# Patient Record
Sex: Male | Born: 1960
Health system: Southern US, Community
[De-identification: ages and names within clinical notes are randomized; demographics above are authoritative.]

## PROBLEM LIST (undated history)

## (undated) DIAGNOSIS — J45909 Unspecified asthma, uncomplicated: Secondary | ICD-10-CM

## (undated) DIAGNOSIS — I10 Essential (primary) hypertension: Secondary | ICD-10-CM

## (undated) HISTORY — PX: KNEE SURGERY: SHX244

## (undated) HISTORY — PX: HERNIA REPAIR: SHX51

## (undated) HISTORY — PX: SHOULDER SURGERY: SHX246

---

## 2018-11-17 DIAGNOSIS — R52 Pain, unspecified: Secondary | ICD-10-CM | POA: Diagnosis not present

## 2018-11-17 DIAGNOSIS — J101 Influenza due to other identified influenza virus with other respiratory manifestations: Secondary | ICD-10-CM | POA: Diagnosis not present

## 2018-11-23 DIAGNOSIS — J0110 Acute frontal sinusitis, unspecified: Secondary | ICD-10-CM | POA: Diagnosis not present

## 2018-11-23 DIAGNOSIS — J45901 Unspecified asthma with (acute) exacerbation: Secondary | ICD-10-CM | POA: Diagnosis not present

## 2019-05-05 DIAGNOSIS — R0602 Shortness of breath: Secondary | ICD-10-CM | POA: Diagnosis not present

## 2019-05-05 DIAGNOSIS — R079 Chest pain, unspecified: Secondary | ICD-10-CM | POA: Diagnosis not present

## 2019-05-05 DIAGNOSIS — Z7951 Long term (current) use of inhaled steroids: Secondary | ICD-10-CM | POA: Diagnosis not present

## 2019-05-05 DIAGNOSIS — Z885 Allergy status to narcotic agent status: Secondary | ICD-10-CM | POA: Diagnosis not present

## 2019-05-05 DIAGNOSIS — R05 Cough: Secondary | ICD-10-CM | POA: Diagnosis not present

## 2019-05-05 DIAGNOSIS — R0789 Other chest pain: Secondary | ICD-10-CM | POA: Diagnosis not present

## 2019-05-05 DIAGNOSIS — J45909 Unspecified asthma, uncomplicated: Secondary | ICD-10-CM | POA: Diagnosis not present

## 2019-05-10 DIAGNOSIS — M791 Myalgia, unspecified site: Secondary | ICD-10-CM | POA: Diagnosis not present

## 2019-05-10 DIAGNOSIS — R011 Cardiac murmur, unspecified: Secondary | ICD-10-CM | POA: Diagnosis not present

## 2019-05-10 DIAGNOSIS — R0602 Shortness of breath: Secondary | ICD-10-CM | POA: Diagnosis not present

## 2019-05-10 DIAGNOSIS — R002 Palpitations: Secondary | ICD-10-CM | POA: Diagnosis not present

## 2019-05-11 DIAGNOSIS — R5383 Other fatigue: Secondary | ICD-10-CM | POA: Diagnosis not present

## 2019-05-11 DIAGNOSIS — R002 Palpitations: Secondary | ICD-10-CM | POA: Diagnosis not present

## 2019-05-11 DIAGNOSIS — J111 Influenza due to unidentified influenza virus with other respiratory manifestations: Secondary | ICD-10-CM | POA: Diagnosis not present

## 2019-05-11 DIAGNOSIS — R0602 Shortness of breath: Secondary | ICD-10-CM | POA: Diagnosis not present

## 2019-05-11 DIAGNOSIS — M791 Myalgia, unspecified site: Secondary | ICD-10-CM | POA: Diagnosis not present

## 2019-05-17 DIAGNOSIS — H539 Unspecified visual disturbance: Secondary | ICD-10-CM | POA: Diagnosis not present

## 2019-05-21 DIAGNOSIS — R002 Palpitations: Secondary | ICD-10-CM | POA: Diagnosis not present

## 2019-05-21 DIAGNOSIS — R0602 Shortness of breath: Secondary | ICD-10-CM | POA: Diagnosis not present

## 2019-05-21 DIAGNOSIS — R011 Cardiac murmur, unspecified: Secondary | ICD-10-CM | POA: Diagnosis not present

## 2019-05-21 DIAGNOSIS — M791 Myalgia, unspecified site: Secondary | ICD-10-CM | POA: Diagnosis not present

## 2019-06-17 DIAGNOSIS — I519 Heart disease, unspecified: Secondary | ICD-10-CM | POA: Diagnosis not present

## 2019-06-17 DIAGNOSIS — I080 Rheumatic disorders of both mitral and aortic valves: Secondary | ICD-10-CM | POA: Diagnosis not present

## 2019-06-17 DIAGNOSIS — I517 Cardiomegaly: Secondary | ICD-10-CM | POA: Diagnosis not present

## 2019-06-25 DIAGNOSIS — R0602 Shortness of breath: Secondary | ICD-10-CM | POA: Diagnosis not present

## 2019-06-25 DIAGNOSIS — R011 Cardiac murmur, unspecified: Secondary | ICD-10-CM | POA: Diagnosis not present

## 2019-06-25 DIAGNOSIS — R002 Palpitations: Secondary | ICD-10-CM | POA: Diagnosis not present

## 2019-06-25 DIAGNOSIS — M791 Myalgia, unspecified site: Secondary | ICD-10-CM | POA: Diagnosis not present

## 2019-07-09 DIAGNOSIS — M549 Dorsalgia, unspecified: Secondary | ICD-10-CM | POA: Diagnosis not present

## 2019-07-09 DIAGNOSIS — M542 Cervicalgia: Secondary | ICD-10-CM | POA: Diagnosis not present

## 2019-07-20 DIAGNOSIS — M47812 Spondylosis without myelopathy or radiculopathy, cervical region: Secondary | ICD-10-CM | POA: Diagnosis not present

## 2019-07-20 DIAGNOSIS — M47814 Spondylosis without myelopathy or radiculopathy, thoracic region: Secondary | ICD-10-CM | POA: Diagnosis not present

## 2019-07-20 DIAGNOSIS — Z87828 Personal history of other (healed) physical injury and trauma: Secondary | ICD-10-CM | POA: Diagnosis not present

## 2019-07-20 DIAGNOSIS — S199XXA Unspecified injury of neck, initial encounter: Secondary | ICD-10-CM | POA: Diagnosis not present

## 2019-07-20 DIAGNOSIS — M542 Cervicalgia: Secondary | ICD-10-CM | POA: Diagnosis not present

## 2019-07-23 DIAGNOSIS — Z712 Person consulting for explanation of examination or test findings: Secondary | ICD-10-CM | POA: Diagnosis not present

## 2019-11-08 ENCOUNTER — Encounter: Payer: Self-pay | Admitting: Emergency Medicine

## 2019-11-08 ENCOUNTER — Emergency Department (INDEPENDENT_AMBULATORY_CARE_PROVIDER_SITE_OTHER)
Admission: EM | Admit: 2019-11-08 | Discharge: 2019-11-08 | Disposition: A | Discharge status: Home / Self Care | Payer: BC Managed Care – PPO | Source: Home / Self Care

## 2019-11-08 ENCOUNTER — Emergency Department (INDEPENDENT_AMBULATORY_CARE_PROVIDER_SITE_OTHER): Payer: BC Managed Care – PPO

## 2019-11-08 ENCOUNTER — Other Ambulatory Visit: Payer: Self-pay

## 2019-11-08 DIAGNOSIS — S8992XA Unspecified injury of left lower leg, initial encounter: Secondary | ICD-10-CM | POA: Diagnosis not present

## 2019-11-08 DIAGNOSIS — M25462 Effusion, left knee: Secondary | ICD-10-CM

## 2019-11-08 HISTORY — DX: Essential (primary) hypertension: I10

## 2019-11-08 HISTORY — DX: Unspecified asthma, uncomplicated: J45.909

## 2019-11-08 MED ORDER — DICLOFENAC SODIUM 25 MG PO TBEC: 25.0000 mg | DELAYED_RELEASE_TABLET | Freq: Two times a day (BID) | ORAL | 0 refills | Status: AC | PRN

## 2019-11-08 MED ORDER — KETOROLAC TROMETHAMINE 60 MG/2ML IM SOLN: 60.0000 mg | Freq: Once | INTRAMUSCULAR | Status: DC

## 2019-11-08 NOTE — Discharge Instructions (Addendum)
Follow-up with Dr. Karie Schwalbe tomorrow. Prescribed diclofenac 25 mg twice daily as needed for knee pain.

## 2019-11-08 NOTE — ED Triage Notes (Signed)
Pt c/o left knee pain after falling while working in his garage yesterday. No history of left knee problems.

## 2019-11-08 NOTE — ED Provider Notes (Signed)
[facility]    CSN: [id number] Arrival date & time: 11/08/19  1028      History   Chief Complaint Chief Complaint  Patient presents with  . Knee Pain    HPI Hisao Doo is a 59 y.o. male.   HPI   Duriel Deery presents for evaluation of left knee pain.  Patient reports doing some work in his garage yesterday sequently fell and twisted his left leg injuring his left knee.  He reports some mild swelling although significant tenderness with bending and extending his knee.  He has noticed increased tenderness today and took an Aleve without significant improvement.  He did ice and elevate temporarily yesterday evening following the injury with minimal relief.  He denies any prior injury to his left knee.  Past Medical History:  Diagnosis Date  . Asthma   . Hypertension     There are no problems to display for this patient.   History reviewed. No pertinent surgical history.     Home Medications    Prior to Admission medications   Medication Sig Start Date End Date Taking? Authorizing Provider  albuterol (PROVENTIL) (2.5 MG/3ML) 0.083% nebulizer solution Take 3 mLs (2.5 mg dose) by nebulization every 6 (six) hours as needed for Wheezing. 11/23/18  Yes [provider]  ibuprofen (ADVIL) 200 MG tablet Take by mouth.    [provider]    Family History History reviewed. No pertinent family history.  Social History Social History   Tobacco Use  . Smoking status: Never Smoker  . Smokeless tobacco: Never Used  Substance Use Topics  . Alcohol use: Yes    Alcohol/week: 1.0 standard drinks    Types: 1 Cans of beer per week  . Drug use: Not on file     Allergies   Codeine   Review of Systems Review of Systems Pertinent negatives listed in HPI  Physical Exam Triage Vital Signs ED Triage Vitals  Enc Vitals Group     BP 11/08/19 1052 (!) 150/99     Pulse Rate 11/08/19 1052 80     Resp --      Temp 11/08/19 1052 98.5 F (36.9  C)     Temp Source 11/08/19 1052 Oral     SpO2 11/08/19 1052 97 %     Weight 11/08/19 1049 225 lb (102.1 kg)     Height --      Head Circumference --      Peak Flow --      Pain Score 11/08/19 1048 6     Pain Loc --      Pain Edu? --      Excl. in GC? --    No data found.  Updated Vital Signs BP (!) 150/99 (BP Location: Right Arm)   Pulse 80   Temp 98.5 F (36.9 C) (Oral)   Wt 225 lb (102.1 kg)   SpO2 97%   Visual Acuity Right Eye Distance:   Left Eye Distance:   Bilateral Distance:    Right Eye Near:   Left Eye Near:    Bilateral Near:     Physical Exam General appearance: alert, well developed, well nourished, cooperative and in no distress Head: Normocephalic, without obvious abnormality, atraumatic Respiratory: Respirations even and unlabored, normal respiratory rate Heart: rate and rhythm normal.  Extremities: Left knee: palpable tenderness anterior lateral patellar region , trace edema present , decrease ROM  Skin: Skin color, texture, turgor normal. No rashes seen  Psych: Appropriate mood  and affect. Neurologic: Alert, oriented to person, place, and time, thought content appropriate. UC Treatments / Results  Labs (all labs ordered are listed, but only abnormal results are displayed) Labs Reviewed - No data to display  EKG   Radiology No results found.  Procedures Procedures (including critical care time)  Medications Ordered in UC Medications - No data to display  Initial Impression / Assessment and Plan / UC Course  I have reviewed the triage vital signs and the nursing notes.  Pertinent labs & imaging results that were available during my care of the patient were reviewed by me and considered in my medical decision making (see chart for details).      Injury of left knee, initial encounter Effusion of left knee joint Imaging negative for an acute fracture and or chronic changes to left knee . Imaging revealed a left knee joint effusion.  Patient referred to sports medicine Dr. Darene Lamer for further evaluation. Diclofenac 25 mg BID PRN for pain and inflammation. Knee sleeve placed to provide compression and further aid in reduction of swelling and pain. Patient declined Toradol. Weight bearing as tolerated. Final Clinical Impressions(s) / UC Diagnoses   Final diagnoses:  Injury of left knee, initial encounter  Effusion of left knee joint     Discharge Instructions     Follow-up with Dr. Darene Lamer tomorrow. Prescribed diclofenac 25 mg twice daily as needed for knee pain.    ED Prescriptions    None     PDMP not reviewed this encounter.   Scot Jun, FNP 11/09/19 1134

## 2019-11-09 ENCOUNTER — Encounter: Payer: Self-pay | Admitting: Sports Medicine

## 2019-11-09 ENCOUNTER — Ambulatory Visit (INDEPENDENT_AMBULATORY_CARE_PROVIDER_SITE_OTHER): Payer: BC Managed Care – PPO | Admitting: Sports Medicine

## 2019-11-09 DIAGNOSIS — M5030 Other cervical disc degeneration, unspecified cervical region: Secondary | ICD-10-CM

## 2019-11-09 DIAGNOSIS — S8992XA Unspecified injury of left lower leg, initial encounter: Secondary | ICD-10-CM

## 2019-11-09 NOTE — Assessment & Plan Note (Addendum)
Slipped and fell in the garage, knee was forced into valgus. He had subsequent pain and swelling, mostly at the lateral joint line. I do feel some weakness at the ACL on exam, positive Lachman sign and anterior drawer sign, as well as pain with terminal flexion all consistent with injury to the ACL and lateral meniscus. Injection today. I would like an MRI. Return to see me to go over MRI results.

## 2019-11-09 NOTE — Addendum Note (Signed)
Addended by: Monica Becton on: 11/09/2019 09:51 AM   Modules accepted: Orders

## 2019-11-09 NOTE — Progress Notes (Addendum)
    Procedures performed today:    Procedure: Real-time Ultrasound Guided injection of left knee Device: Samsung HS60  Verbal informed consent obtained.  Time-out conducted.  Noted no overlying erythema, induration, or other signs of local infection.  Skin prepped in a sterile fashion.  Local anesthesia: Topical Ethyl chloride.  With sterile technique and under real time ultrasound guidance: 1 cc Kenalog 40, 2 cc lidocaine, 2 cc bupivacaine injected easily Completed without difficulty  Pain immediately resolved suggesting accurate placement of the medication.  Advised to call if fevers/chills, erythema, induration, drainage, or persistent bleeding.  Images permanently stored and available for review in the ultrasound unit.  Impression: Technically successful ultrasound guided injection.  Independent interpretation of tests performed by another provider:   I personally reviewed his x-rays, there for the most part unremarkable with the exception of an effusion.  Impression and Recommendations:    Left knee injury Slipped and fell in the garage, knee was forced into valgus. He had subsequent pain and swelling, mostly at the lateral joint line. I do feel some weakness at the ACL on exam, positive Lachman sign and anterior drawer sign, as well as pain with terminal flexion all consistent with injury to the ACL and lateral meniscus. Injection today. I would like an MRI. Return to see me to go over MRI results.  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 He had his cardiologist order cervical spine MRI, these results are available in care everywhere. We are going to start with aggressive physical therapy, diclofenac before considering intervention into the cervical  spine.    ___________________________________________ Ihor Austin. Benjamin Stain, M.D., ABFM., CAQSM. Primary Care and Sports Medicine [facility]  Adjunct Instructor of Family Medicine  [employer]

## 2019-11-09 NOTE — Assessment & Plan Note (Signed)
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 He had his cardiologist order cervical spine MRI, these results are available in care everywhere. We are going to start with aggressive physical therapy, diclofenac before considering intervention into the cervical spine.

## 2019-11-14 ENCOUNTER — Ambulatory Visit (INDEPENDENT_AMBULATORY_CARE_PROVIDER_SITE_OTHER): Payer: BC Managed Care – PPO

## 2019-11-14 ENCOUNTER — Other Ambulatory Visit: Payer: Self-pay

## 2019-11-14 DIAGNOSIS — S8992XA Unspecified injury of left lower leg, initial encounter: Secondary | ICD-10-CM

## 2019-11-14 DIAGNOSIS — M25562 Pain in left knee: Secondary | ICD-10-CM | POA: Diagnosis not present

## 2019-11-15 ENCOUNTER — Ambulatory Visit: Payer: BC Managed Care – PPO | Attending: Sports Medicine | Admitting: Physical Therapy

## 2019-11-15 ENCOUNTER — Encounter: Payer: Self-pay | Admitting: Physical Therapy

## 2019-11-15 DIAGNOSIS — M25562 Pain in left knee: Secondary | ICD-10-CM | POA: Diagnosis not present

## 2019-11-15 DIAGNOSIS — R262 Difficulty in walking, not elsewhere classified: Secondary | ICD-10-CM | POA: Diagnosis present

## 2019-11-15 NOTE — Therapy (Addendum)
[facility] [address]  [place], [place], [address] Phone: [phone]   Fax:  [phone]  Physical Therapy Evaluation  Patient Details  Name: Seth Gutierrez MRN: [id number] Date of Birth: [birth year] Referring Provider (PT): Aundria Mems, MD   Encounter Date: 11/15/2019  PT End of Session - 11/15/19 1139    Visit Number  1    Number of Visits  9    Date for PT Re-Evaluation  12/13/19    Authorization Type  BC[employer]    Authorization - Visit Number  1    Authorization - Number of Visits  30    PT Start Time  5208    PT Stop Time  1129    PT Time Calculation (min)  43 min    Activity Tolerance  Patient tolerated treatment well    Behavior During Therapy  [facility] for tasks assessed/performed       Past Medical History:  Diagnosis Date  . Asthma   . Hypertension     History reviewed. No pertinent surgical history.  There were no vitals filed for this visit.   Subjective Assessment - 11/15/19 1048    Subjective  Patient reporting L knee pain since a fall on 11/08/19. "Fell into a split" with the L knee falling into valgus. Pain occurs over L lateral knee. Had a cortisone injection which relieved the pain almost entirely. Today having only slight remaining pain. Worse with stairs, pivoting, and moving into valgus/varus. Knee feels "lose." Patient confirms a hx of neck pain but wanting to hold off on treatment for this d/t concern of a malignant cause of his symptoms such as a brain tumor. Notes that he has episodes where he "blacks out" when turning his head.    Pertinent History  HTN, asthma    Limitations  Standing;Walking;House hold activities    Diagnostic tests  11/14/19 L knee MRI: Negative for acute bony abnormality, meniscal or ligament tear. ACLworse than PCL mucoid degeneration noted. Subcutaneous edema anterior to the patellar and patellar tendon maybe due to contusion given the patient's history of  fall.    Patient Stated Goals  "better movement"    Currently in Pain?  Yes    Pain Score  3     Pain Location  Knee    Pain Orientation  Left;Lateral    Pain Descriptors / Indicators  Dull    Pain Type  Acute pain         [facility] PT Assessment - 11/15/19 1057      Assessment   Medical Diagnosis  Cervical DDD, L knee meniscal tear    Referring Provider (PT)  Aundria Mems, MD    Onset Date/Surgical Date  11/08/19    Next MD Visit  not scheduled    Prior Therapy  yes      Balance Screen   Has the patient fallen in the past 6 months  Yes    How many times?  1    Has the patient had a decrease in activity level because of a fear of falling?   No    Is the patient reluctant to leave their home because of a fear of falling?   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, stairs, walking    Leisure  Art therapist      Cognition   Overall Cognitive Status  Within Functional Limits for tasks assessed      Observation/Other Assessments   Observations  very mild L lateral knee edema      Sensation   Light Touch  Appears Intact   N/T in B hands intermittently     Coordination   Gross Motor Movements are Fluid and Coordinated  Yes      Posture/Postural Control   Posture/Postural Control  Postural limitations    Postural Limitations  Rounded Shoulders      ROM / Strength   AROM / PROM / Strength  AROM;Strength;PROM      AROM   AROM  Assessment Site  Knee    Right/Left Knee  Right;Left    Right Knee Extension  0    Right Knee Flexion  120    Left Knee Extension  0    Left Knee Flexion  125      PROM   PROM Assessment Site  Knee    Right/Left Knee  Right;Left    Right Knee Extension  0    Right Knee Flexion  125    Left Knee Extension  0    Left Knee Flexion  125      Strength   Strength Assessment Site  Hip;Knee;Ankle    Right/Left Hip  Right;Left    Right Hip Flexion  5/5    Right Hip ABduction  4+/5    Right Hip ADduction  4+/5    Left Hip Flexion  5/5    Left Hip ABduction  4+/5    Left Hip ADduction  4+/5    Right/Left Knee  Right;Left    Right Knee Flexion  5/5    Right Knee Extension  5/5    Left Knee Flexion  4/5   mild pain and c/o weakness   Left Knee Extension  4/5   mild pain and c/o weakness   Right/Left Ankle  Right;Left    Right Ankle Dorsiflexion  5/5    Right Ankle Plantar Flexion  4+/5    Left Ankle Dorsiflexion  5/5    Left Ankle Plantar Flexion  4+/5      Flexibility   Soft Tissue Assessment /Muscle Length  yes    Hamstrings  B WFL    Quadriceps  L moderately tight, R WFL in mod thomas position     ITB  L mildlt tight, R WFL      Palpation   Patella mobility  mild hypomobility in superior/inferior directions on R & L side    Palpation comment  mildly TTP over L superolateral aspect of patella      Ambulation/Gait   Assistive device  None    Gait Pattern  Step-through pattern;Within Functional Limits    Ambulation Surface  Level;Indoor    Gait velocity  WNL                Objective measurements completed on  examination: See above findings.              PT Education - 11/15/19 1139    Education Details  prognosis, POC, HEP    Person(s) Educated  Patient    Methods  Explanation;Demonstration;Tactile cues;Verbal cues;Handout    Comprehension  Verbalized understanding;Returned demonstration       PT Short Term Goals - 11/15/19 1146      PT SHORT  TERM GOAL #1   Title  Patient to be independent with initial HEP.    Time  2    Period  Weeks    Status  New    Target Date  11/29/19        PT Long Term Goals - 11/15/19 1147      PT LONG TERM GOAL #1   Title  Patient to be independent with advanced HEP.    Time  4    Period  Weeks    Status  New    Target Date  12/13/19      PT LONG TERM GOAL #2   Title  Patient to demonstrate B LE strength 4+/5 without pain.    Time  4    Period  Weeks    Status  New    Target Date  12/13/19      PT LONG TERM GOAL #3   Title  Patient to demonstrate no tighness in L hip flexor/quad and TFL.    Time  4    Period  Weeks    Status  New    Target Date  12/13/19      PT LONG TERM GOAL #4   Title  Patient to report 0/10 pain with stairs.    Time  4    Period  Weeks    Status  New    Target Date  12/13/19      PT LONG TERM GOAL #5   Title  Patient to return to fitness regimen without pain limiting.    Time  4    Period  Weeks    Status  New    Target Date  12/13/19             Plan - 11/15/19 1140    Clinical Impression Statement  Patient is a 59y/o M presenting to OPPT with c/o L lateral knee pain after a fall on 11/08/19. Received injection which has relieved nearly all pain. Remaining pain occurs over L lateral knee. Worse with stairs, pivoting, and moving into valgus/varus. Patient notes that his knee feels "loose." Received PT order also includes neck pain, however patient requesting to hold off on treatment for this for now d/t concerns of malignancy and "black outs." Patient today presenting with decreased L LE strength, tightness in L hip flexors/quads and TFL, B patellar hypomobility in superior/inferior directions, and tenderness over L superolateral aspect of patella. Patient educated on gentle stretching and strengthening HEP- patient reported understanding. Would benefit from skilled PT services 2x/week for 4 weeks as needed to address remaining impairments.     Personal Factors and Comorbidities  Age;Comorbidity 2;Fitness;Past/Current Experience;Profession;Time since onset of injury/illness/exacerbation    Comorbidities  HTN, asthma    Examination-Activity Limitations  Bend;Squat;Stairs;Stand;Locomotion Level;Lift    Examination-Participation Restrictions  Church;Cleaning;Shop;Yard Work;Interpersonal Relationship;Meal Prep    Stability/Clinical Decision Making  Stable/Uncomplicated    Clinical Decision Making  Low    Rehab Potential  Good    PT Frequency  2x / week    PT Duration  4  weeks    PT Treatment/Interventions  ADLs/Self Care Home Management;Cryotherapy;Electrical Stimulation;Iontophoresis 58m/ml Dexamethasone;Moist Heat;Balance training;Therapeutic exercise;Therapeutic activities;Functional mobility training;Stair training;Gait training;Ultrasound;Neuromuscular re-education;Patient/family education;Manual techniques;Vasopneumatic Device;Taping;Energy conservation;Dry needling;Passive range of motion    PT Next Visit Plan  reassess HEP    Consulted and Agree with Plan of Care  Patient       Patient will benefit from skilled therapeutic intervention in order to improve the following deficits and impairments:  Hypomobility, Increased edema, Decreased activity tolerance, Decreased strength, Pain, Difficulty walking, Postural dysfunction, Impaired flexibility  Visit Diagnosis: Acute pain of left knee  Difficulty in walking, not elsewhere classified     Problem List Patient Active Problem List   Diagnosis Date Noted  . Left knee injury 11/09/2019  . DDD (degenerative disc disease), cervical 11/09/2019      YJanene Harvey PT, DPT 11/15/19 11:51 AM   [facility] [place] [address]Phone: [phone]  Fax:  [phone] Name: Seth VanwagonerMRN: [id number] of Birth: [birth year]  PHYSICAL THERAPY DISCHARGE SUMMARY  Visits from  Start of Care: 1  Current functional level related to goals / functional outcomes: Unable to assess; patient did not return   Remaining deficits: See above   Education / Equipment: HEP  Plan: Patient agrees to discharge.  Patient goals were not met. Patient is being discharged due to not returning since the last visit.  ?????     YJanene Harvey PT, DPT 12/22/19 10:46 AM

## 2019-11-17 ENCOUNTER — Ambulatory Visit (INDEPENDENT_AMBULATORY_CARE_PROVIDER_SITE_OTHER): Payer: BC Managed Care – PPO | Admitting: Sports Medicine

## 2019-11-17 ENCOUNTER — Other Ambulatory Visit: Payer: Self-pay

## 2019-11-17 DIAGNOSIS — H547 Unspecified visual loss: Secondary | ICD-10-CM | POA: Diagnosis not present

## 2019-11-17 DIAGNOSIS — S8992XD Unspecified injury of left lower leg, subsequent encounter: Secondary | ICD-10-CM | POA: Diagnosis not present

## 2019-11-17 DIAGNOSIS — M5030 Other cervical disc degeneration, unspecified cervical region: Secondary | ICD-10-CM | POA: Diagnosis not present

## 2019-11-17 NOTE — Assessment & Plan Note (Signed)
Seth Gutierrez also has had chronic neck pain, he is a power lifter, and a significant history of cervical DDD, MRI results are available in care everywhere but he has multilevel widespread degenerative changes with multiple levels of foraminal stenosis. He has significant discomfort with turning his head to the right, and radiation down the right arm in a C7 distribution. We will start with formal physical therapy for 6 weeks before considering a cervical epidural. Continue diclofenac, he desires to avoid other medications including muscle relaxers.

## 2019-11-17 NOTE — Assessment & Plan Note (Signed)
In addition to the below Seth Gutierrez does complain of visual loss when he turns his head to the right to the extreme. He also feels as though he is going to pass out sometimes. My concern is certainly a vascular process, I would like him to get an opinion from vascular surgery. He did not note any improvement with systemic effect from his knee injection. We are going to check some labs including ESR and antineutrophil cytoplasmic antibody titers as this could represent a vasculitis.

## 2019-11-17 NOTE — Assessment & Plan Note (Signed)
[place] returns, he slipped and fell in the garage and his knee was forced into valgus. His MRI showed only areas of contusion, we did inject his knee at that time and he returns today completely pain-free.

## 2019-11-17 NOTE — Progress Notes (Signed)
    Procedures performed today:    None.  Independent interpretation of tests performed by another provider:   Cervical spine MRI October 2020:  C2-C3: No significant disc herniation, spinal canal, or neuroforaminal compromise. C3-C4: Facet arthrosis on the RIGHT. Disc osteophytic complex on the LEFT. This results in mild RIGHT neuroforaminal stenosis. Spinal canal and LEFT neuroforaminal patent. C4-C5: Small disc osteophytic complex producing mild effacement of the ventral thecal sac. There is mild RIGHT neuroforaminal stenosis. The LEFT neuroforamen is patent. C5-C6: Loss of disc height with a disc osteophytic complex, more pronounced on the LEFT. Mild effacement of the ventral thecal sac and LEFT neural foraminal stenosis. RIGHT neuroforamen is patent. C6-C7: Loss of disc height. Spinal canal and foramen are patent. C7-T1: No significant disc herniation, spinal canal, or neuroforaminal compromise.  Impression and Recommendations:    Left knee injury Caster returns, he slipped and fell in the garage and his knee was forced into valgus. His MRI showed only areas of contusion, we did inject his knee at that time and he returns today completely pain-free.  DDD (degenerative disc disease), cervical Helio also has had chronic neck pain, he is a power lifter, and a significant history of cervical DDD, MRI results are available in care everywhere but he has multilevel widespread degenerative changes with multiple levels of foraminal stenosis. He has significant discomfort with turning his head to the right, and radiation down the right arm in a C7 distribution. We will start with formal physical therapy for 6 weeks before considering a cervical epidural. Continue diclofenac, he desires to avoid other medications including muscle relaxers.   Visual loss In addition to the below [place] does complain of visual loss when he turns his head to the right to the extreme. He also feels as though he is  going to pass out sometimes. My concern is certainly a vascular process, I would like him to get an opinion from vascular surgery. He did not note any improvement with systemic effect from his knee injection. We are going to check some labs including ESR and antineutrophil cytoplasmic antibody titers as this could represent a vasculitis.    ___________________________________________ Gwen Her. Dianah Field, M.D., ABFM., CAQSM. Primary Care and [place] Instructor of [place] of [employer] [facility] [employer]

## 2019-11-18 LAB — COMPLETE METABOLIC PANEL WITH GFR
AG Ratio: 2 (calc) (ref 1.0–2.5)
ALT: 61 U/L — ABNORMAL HIGH (ref 9–46)
AST: 31 U/L (ref 10–35)
Albumin: 4.3 g/dL (ref 3.6–5.1)
Alkaline phosphatase (APISO): 48 U/L (ref 35–144)
BUN: 19 mg/dL (ref 7–25)
CO2: 33 mmol/L — ABNORMAL HIGH (ref 20–32)
Calcium: 9.2 mg/dL (ref 8.6–10.3)
Chloride: 98 mmol/L (ref 98–110)
Creat: 1.12 mg/dL (ref 0.70–1.33)
GFR, Est African American: 85 mL/min/{1.73_m2} (ref >=60)
GFR, Est Non African American: 73 mL/min/{1.73_m2} (ref >=60)
Globulin: 2.2 g/dL (calc) (ref 1.9–3.7)
Glucose, Bld: 87 mg/dL (ref 65–99)
Potassium: 4.4 mmol/L (ref 3.5–5.3)
Sodium: 137 mmol/L (ref 135–146)
Total Bilirubin: 0.9 mg/dL (ref 0.2–1.2)
Total Protein: 6.5 g/dL (ref 6.1–8.1)

## 2019-11-18 LAB — ANCA SCREEN W REFLEX TITER: ANCA Screen: NEGATIVE

## 2019-11-18 LAB — CBC
HCT: 50.5 % — ABNORMAL HIGH (ref 38.5–50.0)
Hemoglobin: 18 g/dL — ABNORMAL HIGH (ref 13.2–17.1)
MCH: 32 pg (ref 27.0–33.0)
MCHC: 35.6 g/dL (ref 32.0–36.0)
MCV: 89.7 fL (ref 80.0–100.0)
MPV: 9.8 fL (ref 7.5–12.5)
Platelets: 175 10*3/uL (ref 140–400)
RBC: 5.63 10*6/uL (ref 4.20–5.80)
RDW: 12.9 % (ref 11.0–15.0)
WBC: 5.8 10*3/uL (ref 3.8–10.8)

## 2019-11-18 LAB — SEDIMENTATION RATE: Sed Rate: 2 mm/h (ref 0–20)

## 2019-11-18 LAB — C-REACTIVE PROTEIN: CRP: 2.5 mg/L (ref <8.0)

## 2019-11-29 ENCOUNTER — Other Ambulatory Visit: Payer: Self-pay | Admitting: *Deleted

## 2019-11-29 DIAGNOSIS — H547 Unspecified visual loss: Secondary | ICD-10-CM

## 2019-11-29 DIAGNOSIS — R0989 Other specified symptoms and signs involving the circulatory and respiratory systems: Secondary | ICD-10-CM

## 2019-11-30 ENCOUNTER — Ambulatory Visit (HOSPITAL_COMMUNITY)
Admission: RE | Admit: 2019-11-30 | Discharge: 2019-11-30 | Disposition: A | Discharge status: Home / Self Care | Payer: BC Managed Care – PPO | Source: Ambulatory Visit | Attending: Vascular Surgery | Admitting: Vascular Surgery

## 2019-11-30 ENCOUNTER — Ambulatory Visit (INDEPENDENT_AMBULATORY_CARE_PROVIDER_SITE_OTHER): Payer: BC Managed Care – PPO | Admitting: Vascular Surgery

## 2019-11-30 ENCOUNTER — Other Ambulatory Visit: Payer: Self-pay

## 2019-11-30 ENCOUNTER — Encounter: Payer: Self-pay | Admitting: Vascular Surgery

## 2019-11-30 VITALS — BP 145/91 | HR 84 | Temp 97.9°F | Resp 18 | Ht 69.5 in | Wt 220.0 lb

## 2019-11-30 DIAGNOSIS — H547 Unspecified visual loss: Secondary | ICD-10-CM

## 2019-11-30 DIAGNOSIS — R0989 Other specified symptoms and signs involving the circulatory and respiratory systems: Secondary | ICD-10-CM | POA: Diagnosis not present

## 2019-11-30 NOTE — Progress Notes (Signed)
Patient name: Seth Gutierrez MRN: [id number] DOB: [birth year] Sex: male  REASON FOR CONSULT: Visual changes when he turns his head, rule out vascular etiology  HPI: Seth Gutierrez is a 59 y.o. male, with history of hypertension that presents for evaluation of bilateral visual changes when he turns his head.  Patient states this has been ongoing for years and has been become more frequent recently.  He states whenever he turns his head either to the right or left he often will get vision changes sometimes in one eye sometimes in both eyes.  He does not actually lose vision but states things just appear hazy like he is wearing sunglasses.  Sometimes this will last for several minutes and then resolve.  He states he was told he had optical migraines by eye doctor years ago but has never seen a specialists or treated otherwise.  He also has significant pain in his right shoulder up into the right neck that limits his ability to do a lot of lifting and bodybuilding.  He wants to get back in the gym but between the vision changes in the shoulder problem he has a lot of limitation.  He did have an MRI of his cervical spine that showed cervical spondylosis with mild canal stenosis.  No history of TIA or stroke.  No other focal neurologic changes whenever he has these eye events.  He does complain of some numbness in both hands particularly in the second and third index finger bilaterally at times.  He gets some relief in the should pain with alcohol or hot tub.    Past Medical History:  Diagnosis Date  . Asthma   . Hypertension     Past Surgical History:  Procedure Laterality Date  . HERNIA REPAIR    . KNEE SURGERY Right   . SHOULDER SURGERY Right     Family History  Problem Relation Age of Onset  . Diabetes Mother   . Stroke Mother   . Atrial fibrillation Mother   . Diverticulitis Mother   . Angioedema Father   . Aneurysm Father   . Cancer Father     SOCIAL HISTORY: Social History    Socioeconomic History  . Marital status: Married    Spouse name: Not on file  . Number of children: Not on file  . Years of education: Not on file  . Highest education level: Not on file  Occupational History  . Not on file  Tobacco Use  . Smoking status: Never Smoker  . Smokeless tobacco: Never Used  Substance and Sexual Activity  . Alcohol use: Yes    Alcohol/week: 1.0 standard drinks    Types: 1 Cans of beer per week  . Drug use: Not on file  . Sexual activity: Not on file  Other Topics Concern  . Not on file  Social History Narrative  . Not on file   Social Determinants of Health   Financial Resource Strain:   . Difficulty of Paying Living Expenses: Not on file  Food Insecurity:   . Worried About Programme researcher, broadcasting/film/video in the Last Year: Not on file  . Ran Out of Food in the Last Year: Not on file  Transportation Needs:   . Lack of Transportation (Medical): Not on file  . Lack of Transportation (Non-Medical): Not on file  Physical Activity:   . Days of Exercise per Week: Not on file  . Minutes of Exercise per Session: Not on file  Stress:   .  Feeling of Stress : Not on file  Social Connections:   . Frequency of Communication with Friends and Family: Not on file  . Frequency of Social Gatherings with Friends and Family: Not on file  . Attends Religious Services: Not on file  . Active Member of Clubs or Organizations: Not on file  . Attends Banker Meetings: Not on file  . Marital Status: Not on file  Intimate Partner Violence:   . Fear of Current or Ex-Partner: Not on file  . Emotionally Abused: Not on file  . Physically Abused: Not on file  . Sexually Abused: Not on file    Allergies  Allergen Reactions  . Codeine Nausea And Vomiting    Current Outpatient Medications  Medication Sig Dispense Refill  . albuterol (PROVENTIL) (2.5 MG/3ML) 0.083% nebulizer solution Take 3 mLs (2.5 mg dose) by nebulization every 6 (six) hours as needed for  Wheezing.    Marland Kitchen ibuprofen (ADVIL) 200 MG tablet Take by mouth.    . diclofenac (VOLTAREN) 25 MG EC tablet Take 1 tablet (25 mg total) by mouth 2 (two) times daily as needed. (Patient not taking: Reported on 11/09/2019) 30 tablet 0   No current facility-administered medications for this visit.    REVIEW OF SYSTEMS:  [X]  denotes positive finding, [ ]  denotes negative finding Cardiac  Comments:  Chest pain or chest pressure:    Shortness of breath upon exertion:    Short of breath when lying flat:    Irregular heart rhythm:        Vascular    Pain in calf, thigh, or hip brought on by ambulation:    Pain in feet at night that wakes you up from your sleep:     Blood clot in your veins:    Leg swelling:         Pulmonary    Oxygen at home:    Productive cough:     Wheezing:         Neurologic    Sudden weakness in arms or legs:     Sudden numbness in arms or legs:     Sudden onset of difficulty speaking or slurred speech:    Temporary loss of vision in one eye:     Problems with dizziness:         Gastrointestinal    Blood in stool:     Vomited blood:         Genitourinary    Burning when urinating:     Blood in urine:        Psychiatric    Major depression:         Hematologic    Bleeding problems:    Problems with blood clotting too easily:        Skin    Rashes or ulcers:        Constitutional    Fever or chills:      PHYSICAL EXAM: Vitals:   11/30/19 0848 11/30/19 0856  BP: (!) 147/87 (!) 145/91  Pulse: 84 84  Resp: 18   Temp: 97.9 F (36.6 C)   TempSrc: Temporal   SpO2: 100%   Weight: 220 lb (99.8 kg)   Height: 5' 9.5" (1.765 m)     GENERAL: The patient is a well-nourished male, in no acute distress. The vital signs are documented above. CARDIAC: There is a regular rate and rhythm.  VASCULAR:  2+ palpable subclavian, brachial, radial pulses bilateral upper extremities Good grip strength bilateral  upper extremities No upper extremity tissue  loss PULMONARY: There is good air exchange bilaterally without wheezing or rales. ABDOMEN: Soft and non-tender with normal pitched bowel sounds.  MUSCULOSKELETAL: There are no major deformities or cyanosis. NEUROLOGIC: No focal weakness or paresthesias are detected. SKIN: There are no ulcers or rashes noted. PSYCHIATRIC: The patient has a normal affect.  DATA:   I have independently reviewed his carotid duplex and he has no significant carotid disease and both arteries appear fairly smooth and homogenous at the bifurcation.  He has antegrade flow in both vertebral arteries which is normal.  He also has normal flow hemodynamics in both subclavian arteries.  Assessment/Plan:  59 year old male that presents for evaluation of bilateral vision changes whenever he turns his head either to the right or left.  I discussed with him in detail that I do not think this is vascular in etiology.  He has no significant carotid disease based on duplex today and has antegrade flow in both vertebral arteries and we have no evidence of vertebrobasilar insufficiency.  In addition he has normal flow hemodynamics in both subclavian arteries and palpable radial pulses on exam.  He did have provocative maneuvers in the vascular lab today with his ultrasound and it showed no significant change in any of his vascular studies with turning his head.  Not certain if these vision changes are related to this questionable history of ocular migraines.  I suggested that we refer him over to neurology to have further evaluation and possible to see if they have any treatment options for him.  He is obviously very concerned about this when he is driving and is worried about having a car accident.  In addition I think he can go back to sports medicine for further work-up of his shoulder and neck problems as it relates to weight lifting and I do not feel that this is a vascular problem at this time.  Follow-up with future questions or  concerns.   Marty Heck, MD [facility] Office: [phone]

## 2019-12-18 DIAGNOSIS — Z23 Encounter for immunization: Secondary | ICD-10-CM | POA: Diagnosis not present

## 2019-12-29 ENCOUNTER — Ambulatory Visit: Payer: BC Managed Care – PPO | Admitting: Sports Medicine

## 2020-01-11 NOTE — Progress Notes (Deleted)
[facility]    Provider:  Dr Lucia Gaskins Requesting Provider: Sherald Hess, MD Primary Care Provider:  Sunnie Nielsen, MD  CC:  ***  HPI:  Seth Gutierrez is a 59 y.o. male here as requested by Sherald Hess, MD for evaluation of ocular migraines.  I reviewed Dr. Ophelia Charter notes, patient has a history of hypertension and presented for evaluation of bilateral visual changes when he turns his head, has been ongoing for years and has become more frequently recently, whenever he turns his head either to the right of the left he will often get vision changes sometimes in 1 eye sometimes a boat, he does not actually lose vision but things just appear.  Asses, sometimes it will last several minutes and then resolved, he was told he had optical migraines by an eye doctor year ago, he did have an MRI of his cervical spine that showed cervical spondylosis with mild canal stenosis, no history of stroke or TIA, no other focal neurologic changes never he has the symptoms, he does complain of some numbness in both hands.  Examination noted 2+ palpable subclavian, brachial, radial pulses bilateral upper extremities, good grip strength bilateral upper extremities, no upper extremity tissue loss, normal pulmonary, abdomen, musculoskeletal, neurologic, skin and psychiatric evaluations.  Carotid duplex showed no significant disease in both arteries appear fairly smooth and homogenous at the bifurcation with anterograde flow in both vertebral arteries which is normal, he also has normal flow dynamics in both subclavian arteries.  Dr. Chestine Spore did not think this was vascular in etiology, no significant carotid or vertebral artery disease, no evidence of vertebrobasilar insufficiency, normal flow hemodynamics in both subclavian arteries and palpable radial pulses on exam.  We also tested him with provocative maneuvers in the vascular lab with his ultrasound and it showed no sign of changes in any of his  vascular studies with turning of head.  Reviewed notes, labs and imaging from outside physicians, which showed ***  Review of Systems: Patient complains of symptoms per HPI as well as the following symptoms ***. Pertinent negatives and positives per HPI. All others negative.   Social History   Socioeconomic History  . Marital status: Married    Spouse name: Not on file  . Number of children: Not on file  . Years of education: Not on file  . Highest education level: Not on file  Occupational History  . Not on file  Tobacco Use  . Smoking status: Never Smoker  . Smokeless tobacco: Never Used  Substance and Sexual Activity  . Alcohol use: Yes    Alcohol/week: 1.0 standard drinks    Types: 1 Cans of beer per week  . Drug use: Not on file  . Sexual activity: Not on file  Other Topics Concern  . Not on file  Social History Narrative  . Not on file   Social Determinants of Health   Financial Resource Strain:   . Difficulty of Paying Living Expenses:   Food Insecurity:   . Worried About Programme researcher, broadcasting/film/video in the Last Year:   . Barista in the Last Year:   Transportation Needs:   . Freight forwarder (Medical):   Marland Kitchen Lack of Transportation (Non-Medical):   Physical Activity:   . Days of Exercise per Week:   . Minutes of Exercise per Session:   Stress:   . Feeling of Stress :   Social Connections:   . Frequency of Communication with Friends and Family:   .  Frequency of Social Gatherings with Friends and Family:   . Attends Religious Services:   . Active Member of Clubs or Organizations:   . Attends Banker Meetings:   Marland Kitchen Marital Status:   Intimate Partner Violence:   . Fear of Current or Ex-Partner:   . Emotionally Abused:   Marland Kitchen Physically Abused:   . Sexually Abused:     Family History  Problem Relation Age of Onset  . Diabetes Mother   . Stroke Mother   . Atrial fibrillation Mother   . Diverticulitis Mother   . Angioedema Father   .  Aneurysm Father   . Cancer Father     Past Medical History:  Diagnosis Date  . Asthma   . Hypertension     Patient Active Problem List   Diagnosis Date Noted  . Visual loss 11/17/2019  . Left knee injury 11/09/2019  . DDD (degenerative disc disease), cervical 11/09/2019    Past Surgical History:  Procedure Laterality Date  . HERNIA REPAIR    . KNEE SURGERY Right   . SHOULDER SURGERY Right     Current Outpatient Medications  Medication Sig Dispense Refill  . albuterol (PROVENTIL) (2.5 MG/3ML) 0.083% nebulizer solution Take 3 mLs (2.5 mg dose) by nebulization every 6 (six) hours as needed for Wheezing.    . diclofenac (VOLTAREN) 25 MG EC tablet Take 1 tablet (25 mg total) by mouth 2 (two) times daily as needed. (Patient not taking: Reported on 11/09/2019) 30 tablet 0  . ibuprofen (ADVIL) 200 MG tablet Take by mouth.     No current facility-administered medications for this visit.    Allergies as of 01/12/2020 - Review Complete 11/30/2019  Allergen Reaction Noted  . Codeine Nausea And Vomiting 03/20/2012    Vitals: There were no vitals taken for this visit. Last Weight:  Wt Readings from Last 1 Encounters:  11/30/19 220 lb (99.8 kg)   Last Height:   Ht Readings from Last 1 Encounters:  11/30/19 5' 9.5" (1.765 m)     Physical exam: Exam: Gen: NAD, conversant, well nourised, obese, well groomed                     CV: RRR, no MRG. No Carotid Bruits. No peripheral edema, warm, nontender Eyes: Conjunctivae clear without exudates or hemorrhage  Neuro: Detailed Neurologic Exam  Speech:    Speech is normal; fluent and spontaneous with normal comprehension.  Cognition:    The patient is oriented to person, place, and time;     recent and remote memory intact;     language fluent;     normal attention, concentration,     fund of knowledge Cranial Nerves:    The pupils are equal, round, and reactive to light. The fundi are normal and spontaneous venous pulsations  are present. Visual fields are full to finger confrontation. Extraocular movements are intact. Trigeminal sensation is intact and the muscles of mastication are normal. The face is symmetric. The palate elevates in the midline. Hearing intact. Voice is normal. Shoulder shrug is normal. The tongue has normal motion without fasciculations.   Coordination:    Normal finger to nose and heel to shin. Normal rapid alternating movements.   Gait:    Heel-toe and tandem gait are normal.   Motor Observation:    No asymmetry, no atrophy, and no involuntary movements noted. Tone:    Normal muscle tone.    Posture:    Posture is normal. normal erect  Strength:    Strength is V/V in the upper and lower limbs.      Sensation: intact to LT     Reflex Exam:  DTR's:    Deep tendon reflexes in the upper and lower extremities are normal bilaterally.   Toes:    The toes are downgoing bilaterally.   Clonus:    Clonus is absent.    Assessment/Plan:    No orders of the defined types were placed in this encounter.  No orders of the defined types were placed in this encounter.   Cc: Barbette Or, MD,  Barbette Or, MD  Sarina Ill, MD  [facility] [address] [place], [place] [address]  Phone [phone] Fax [phone]

## 2020-01-12 ENCOUNTER — Ambulatory Visit: Payer: BC Managed Care – PPO | Admitting: Neurology

## 2020-01-12 ENCOUNTER — Telehealth: Payer: Self-pay | Admitting: Neurology

## 2020-01-12 NOTE — Telephone Encounter (Signed)
I see pt has been rescheduled .

## 2020-01-12 NOTE — Telephone Encounter (Signed)
I called patient and LVM about 30 minutes prior to appointment to reschedule due to an emergency that came up with one of Dr. Trevor Mace other morning patients. Did not make contact with patient to reschedule. FYI

## 2020-02-15 NOTE — Progress Notes (Addendum)
[facility]    Provider:  Dr Jaynee Eagles Requesting Provider:  Monica Martinez MD Primary Care Provider:  Barbette Or, MD  CC:  Vision changes  HPI:  Seth Gutierrez is a 59 y.o. male here as requested by  Monica Martinez MD for ocular migraines.  Past medical history hypertension, asthma, and bilateral vision changes.  I reviewed Dr. Ainsley Spinner notes: Patient reported bilateral vision changes when he turns his head, has been going on for years and becoming more frequent recently, whenever he turns his head either to the right to the left he will often get vision changes sometimes in 1 eye sometimes in both eyes.  He does not actually lose vision but states things just appear hazy like he is wearing sunglasses.  Sometimes this will last several minutes and then resolved.  He states he was told he had optical migraines by an eye doctor years ago but has never seen a specialist or treated otherwise.  He did have an MRI of his cervical spine that showed cervical spondylosis with mild canal stenosis, no history of TIA or stroke, no other focal neurologic changes whenever he has these episodes, he does complain of some numbness in both hands particularly in the second and third index fingers bilaterally at times.  He has chronic shoulder pain.  Physical examination including cardiac, vascular, pulmonary, abdomen, musculoskeletal, neurologic, skin and psychiatric were normal per Dr. Ainsley Spinner note.  Dr. Carlis Abbott thought that the vision issues are not vascular in etiology, however he has no significant carotid artery disease on testing and antegrade flow in both vertebral arteries and no evidence of vertebrobasilar insufficiency.  In addition normal flow hemodynamics palpable pulses on exam he did have provocative maneuvers in the vascular lab with his ultrasound and it showed no significant change in his vascular studies when turning his head.  He is here alone and he reports everything started  with tightness in the back and neck making it difficult to move neck, when he would turn to the right it would feel like it affects like pulling on his eye, also some blurring when looking to the right and can go dark in the right eye only when turning to the right, resolves quickly, it can also be in both eyes but mostly on the right and it is associated with tightness in the right cervical muscles. More in the morning. He has DIFFERENT issues when not turning t the right but this is different, he starts seeing spinning flashing lights but this is not associated in one or both eyes followed by slight headache on the right side of the head. Headaches in the temple area, flashing lights and to both eyes and interrupts his site 15 minutes maximum. He now has glasses, he doesn't wear them all the time. No weakness, numbness, aphasia. This has been ongoing for years and getting better less frequent as far as the flashing lights and the turning to the right vision changes. Seems to be related to a lot of pain in the neck and the strain on his eyes are better. 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 No other focal neurologic deficits, associated symptoms, inciting events or modifiable factors.  Reviewed notes, labs and imaging from outside physicians, which showed:  I reviewed laboratory studies which showed ANCA normal, CRP normal sed rate normal, CMP with elevated ALT slightly 61, CBC with increased hemoglobin at  18, otherwise normal.  Per review of record from "care everywhere" patient had multiple ultrasound carotids bilateral in the past including August 2020, April 2017,   Review of Systems: Patient complains of symptoms per HPI as well as the following symptoms: headaches. Pertinent negatives and positives per HPI. All others negative.   Social History   Socioeconomic History  . Marital  status: Married    Spouse name: Not on file  . Number of children: 3  . Years of education: Not on file  . Highest education level: Associate degree: academic program  Occupational History  . Not on file  Tobacco Use  . Smoking status: Never Smoker  . Smokeless tobacco: Never Used  Substance and Sexual Activity  . Alcohol use: Yes    Alcohol/week: 21.0 standard drinks    Types: 21 Shots of liquor per week    Comment: 3 vodka mixed drinks per night, says it's a little more than shot each drink   . Drug use: Not on file    Comment: no  . Sexual activity: Not on file  Other Topics Concern  . Not on file  Social History Narrative   Lives at home with wife and his mother lives with them (they are her caretakers)   Right handed   Caffeine: 1 cup coffee in the morning    Social Determinants of Health   Financial Resource Strain:   . Difficulty of Paying Living Expenses:   Food Insecurity:   . Worried About Programme researcher, broadcasting/film/video in the Last Year:   . Barista in the Last Year:   Transportation Needs:   . Freight forwarder (Medical):   Marland Kitchen Lack of Transportation (Non-Medical):   Physical Activity:   . Days of Exercise per Week:   . Minutes of Exercise per Session:   Stress:   . Feeling of Stress :   Social Connections:   . Frequency of Communication with Friends and Family:   . Frequency of Social Gatherings with Friends and Family:   . Attends Religious Services:   . Active Member of Clubs or Organizations:   . Attends Banker Meetings:   Marland Kitchen Marital Status:   Intimate Partner Violence:   . Fear of Current or Ex-Partner:   . Emotionally Abused:   Marland Kitchen Physically Abused:   . Sexually Abused:     Family History  Problem Relation Age of Onset  . Diabetes Mother   . Stroke Mother   . Atrial fibrillation Mother   . Diverticulitis Mother   . Angioedema Father   . Aneurysm Father   . Cancer Father   . Migraines Neg Hx        none that he knows of      Past Medical History:  Diagnosis Date  . Asthma   . Hypertension     Patient Active Problem List   Diagnosis Date Noted  . Myofascial pain syndrome, cervical 02/16/2020  . Migraine with aura and without status migrainosus, not intractable 02/16/2020  . Visual loss 11/17/2019  . Left knee injury 11/09/2019  . DDD (degenerative disc disease), cervical 11/09/2019    Past Surgical History:  Procedure Laterality Date  . HERNIA REPAIR    . KNEE SURGERY Right   . SHOULDER SURGERY Right     Current Outpatient Medications  Medication Sig Dispense Refill  . albuterol (PROVENTIL) (2.5 MG/3ML) 0.083% nebulizer solution Take 3 mLs (2.5 mg dose) by nebulization every 6 (six) hours  as needed for Wheezing.    Marland Kitchen ibuprofen (ADVIL) 200 MG tablet Take by mouth.    . cyclobenzaprine (FLEXERIL) 10 MG tablet Take 1 tablet (10 mg total) by mouth at bedtime. 30 tablet 6  . diclofenac (VOLTAREN) 25 MG EC tablet Take 1 tablet (25 mg total) by mouth 2 (two) times daily as needed. (Patient not taking: Reported on 11/09/2019) 30 tablet 0   No current facility-administered medications for this visit.    Allergies as of 02/16/2020 - Review Complete 02/16/2020  Allergen Reaction Noted  . Codeine Nausea And Vomiting 03/20/2012    Vitals: BP (!) 155/92 (BP Location: Right Arm, Patient Position: Sitting, Cuff Size: Large)   Pulse 77   Ht 5\' 11"  (1.803 m)   Wt 220 lb (99.8 kg)   BMI 30.68 kg/m  Last Weight:  Wt Readings from Last 1 Encounters:  02/16/20 220 lb (99.8 kg)   Last Height:   Ht Readings from Last 1 Encounters:  02/16/20 5\' 11"  (1.803 m)     Physical exam: Exam: Gen: NAD, conversant, well nourised, muscular, well groomed                     CV: RRR, no MRG. No Carotid Bruits. No peripheral edema, warm, nontender Eyes: Conjunctivae clear without exudates or hemorrhage MSK: decreased rotation of the head, tight cervical muscles.   Neuro: Detailed Neurologic Exam  Speech:     Speech is normal; fluent and spontaneous with normal comprehension.  Cognition:    The patient is oriented to person, place, and time;     recent and remote memory intact;     language fluent;     normal attention, concentration,     fund of knowledge Cranial Nerves:    The pupils are equal, round, and reactive to light. The fundi are flat. Visual fields are full to finger confrontation. Extraocular movements are intact. Trigeminal sensation is intact and the muscles of mastication are normal. The face is symmetric. The palate elevates in the midline. Hearing intact. Voice is normal. Shoulder shrug is normal. The tongue has normal motion without fasciculations.   Coordination:    Normal finger to nose and heel to shin. Normal rapid alternating movements.   Gait:    Normal native gait  Motor Observation:    No asymmetry, no atrophy, and no involuntary movements noted. Tone:    Normal muscle tone.    Posture:    Posture is normal. normal erect    Strength:    Strength is V/V in the upper and lower limbs.      Sensation: intact to LT     Reflex Exam:  DTR's:    Deep tendon reflexes in the upper and lower extremities are normal bilaterally.   Toes:    The toes are downgoing bilaterally.   Clonus:    Clonus is absent.    Assessment/Plan:   59 y.o. male here as requested by  MD for ocular migraines, vision loss.  Past medical history hypertension, asthma, and bilateral and monocular vision changes.   - He does have ocular migraines but these are distinct from the vision strain when turning his head to the right which may be due to very tight cervical muscles possibly cervical dystonia.Still he needs an MRI of the brain and MRA of the head.  I would recommend dry needling cervical muscle and if that doesn't help consider botulinum toxin for dystonia. Still think he needs evaluation  with MRI of the brain and MRA of the head to ensure no aneurysms or compressive  masses causing monocular vision changes on the right: MRI brain and MRA head due to concerning symptoms of morning headaches, positional headaches,vision changes, exertional headache  to look for space occupying mass, chiari or intracranial hypertension (pseudotumor), aneurysms - He has had MRI cervical spine ortho in [place], complete eval per patient - been evaluated by vascular.  - He appears to have symptoms of sleep apnea, he declines referral but I highly encouraged him to discuss with pcp. - flexeril at bedtime and dry nedling for myofascial cervical pain and if doesn't work consider botox for cervical dystonia - flexeril at bedtime  Orders Placed This Encounter  Procedures  . MR BRAIN W WO CONTRAST  . MR ANGIO HEAD WO CONTRAST   Meds ordered this encounter  Medications  . cyclobenzaprine (FLEXERIL) 10 MG tablet    Sig: Take 1 tablet (10 mg total) by mouth at bedtime.    Dispense:  30 tablet    Refill:  6    Cc: Cephus Shelling, MD,  Sherald Hess MD  Naomie Dean, MD  [facility] [address] [place], [place] [address]  Phone [phone] Fax [phone]

## 2020-02-16 ENCOUNTER — Encounter: Payer: Self-pay | Admitting: Neurology

## 2020-02-16 ENCOUNTER — Other Ambulatory Visit: Payer: Self-pay

## 2020-02-16 ENCOUNTER — Ambulatory Visit (INDEPENDENT_AMBULATORY_CARE_PROVIDER_SITE_OTHER): Payer: BC Managed Care – PPO | Admitting: Neurology

## 2020-02-16 VITALS — BP 155/92 | HR 77 | Ht 71.0 in | Wt 220.0 lb

## 2020-02-16 DIAGNOSIS — H539 Unspecified visual disturbance: Secondary | ICD-10-CM

## 2020-02-16 DIAGNOSIS — H5460 Unqualified visual loss, one eye, unspecified: Secondary | ICD-10-CM

## 2020-02-16 DIAGNOSIS — G43109 Migraine with aura, not intractable, without status migrainosus: Secondary | ICD-10-CM | POA: Diagnosis not present

## 2020-02-16 DIAGNOSIS — R519 Headache, unspecified: Secondary | ICD-10-CM

## 2020-02-16 DIAGNOSIS — G4484 Primary exertional headache: Secondary | ICD-10-CM

## 2020-02-16 DIAGNOSIS — R51 Headache with orthostatic component, not elsewhere classified: Secondary | ICD-10-CM

## 2020-02-16 DIAGNOSIS — M7918 Myalgia, other site: Secondary | ICD-10-CM

## 2020-02-16 MED ORDER — CYCLOBENZAPRINE HCL 10 MG PO TABS: 10.0000 mg | ORAL_TABLET | Freq: Every day | ORAL | 6 refills | Status: AC

## 2020-02-16 NOTE — Patient Instructions (Addendum)
MR of the brain and blood vessels Dry Needling - Brit PT Magnesium - 400-500 a day Consider botox for dystonia if dry needling does not help (email me) Encourage sleep evaluation Flexeril at bedtime   Sleep Apnea Sleep apnea is a condition in which breathing pauses or becomes shallow during sleep. Episodes of sleep apnea usually last 10 seconds or longer, and they may occur as many as 20 times an hour. Sleep apnea disrupts your sleep and keeps your body from getting the rest that it needs. This condition can increase your risk of certain health problems, including:  Heart attack.  Stroke.  Obesity.  Diabetes.  Heart failure.  Irregular heartbeat. What are the causes? There are three kinds of sleep apnea:  Obstructive sleep apnea. This kind is caused by a blocked or collapsed airway.  Central sleep apnea. This kind happens when the part of the brain that controls breathing does not send the correct signals to the muscles that control breathing.  Mixed sleep apnea. This is a combination of obstructive and central sleep apnea. The most common cause of this condition is a collapsed or blocked airway. An airway can collapse or become blocked if:  Your throat muscles are abnormally relaxed.  Your tongue and tonsils are larger than normal.  You are overweight.  Your airway is smaller than normal. What increases the risk? You are more likely to develop this condition if you:  Are overweight.  Smoke.  Have a smaller than normal airway.  Are elderly.  Are male.  Drink alcohol.  Take sedatives or tranquilizers.  Have a family history of sleep apnea. What are the signs or symptoms? Symptoms of this condition include:  Trouble staying asleep.  Daytime sleepiness and tiredness.  Irritability.  Loud snoring.  Morning headaches.  Trouble concentrating.  Forgetfulness.  Decreased interest in sex.  Unexplained sleepiness.  Mood swings.  Personality  changes.  Feelings of depression.  Waking up often during the night to urinate.  Dry mouth.  Sore throat. How is this diagnosed? This condition may be diagnosed with:  A medical history.  A physical exam.  A series of tests that are done while you are sleeping (sleep study). These tests are usually done in a sleep lab, but they may also be done at home. How is this treated? Treatment for this condition aims to restore normal breathing and to ease symptoms during sleep. It may involve managing health issues that can affect breathing, such as high blood pressure or obesity. Treatment may include:  Sleeping on your side.  Using a decongestant if you have nasal congestion.  Avoiding the use of depressants, including alcohol, sedatives, and narcotics.  Losing weight if you are overweight.  Making changes to your diet.  Quitting smoking.  Using a device to open your airway while you sleep, such as: ? An oral appliance. This is a custom-made mouthpiece that shifts your lower jaw forward. ? A continuous positive airway pressure (CPAP) device. This device blows air through a mask when you breathe out (exhale). ? A nasal expiratory positive airway pressure (EPAP) device. This device has valves that you put into each nostril. ? A bi-level positive airway pressure (BPAP) device. This device blows air through a mask when you breathe in (inhale) and breathe out (exhale).  Having surgery if other treatments do not work. During surgery, excess tissue is removed to create a wider airway. It is important to get treatment for sleep apnea. Without treatment, this condition can  lead to:  High blood pressure.  Coronary artery disease.  In men, an inability to achieve or maintain an erection (impotence).  Reduced thinking abilities. Follow these instructions at home: Lifestyle  Make any lifestyle changes that your health care provider recommends.  Eat a healthy, well-balanced  diet.  Take steps to lose weight if you are overweight.  Avoid using depressants, including alcohol, sedatives, and narcotics.  Do not use any products that contain nicotine or tobacco, such as cigarettes, e-cigarettes, and chewing tobacco. If you need help quitting, ask your health care provider. General instructions  Take over-the-counter and prescription medicines only as told by your health care provider.  If you were given a device to open your airway while you sleep, use it only as told by your health care provider.  If you are having surgery, make sure to tell your health care provider you have sleep apnea. You may need to bring your device with you.  Keep all follow-up visits as told by your health care provider. This is important. Contact a health care provider if:  The device that you received to open your airway during sleep is uncomfortable or does not seem to be working.  Your symptoms do not improve.  Your symptoms get worse. Get help right away if:  You develop: ? Chest pain. ? Shortness of breath. ? Discomfort in your back, arms, or stomach.  You have: ? Trouble speaking. ? Weakness on one side of your body. ? Drooping in your face. These symptoms may represent a serious problem that is an emergency. Do not wait to see if the symptoms will go away. Get medical help right away. Call your local emergency services (911 in the [place] Do not drive yourself to the hospital. Summary  Sleep apnea is a condition in which breathing pauses or becomes shallow during sleep.  The most common cause is a collapsed or blocked airway.  The goal of treatment is to restore normal breathing and to ease symptoms during sleep. This information is not intended to replace advice given to you by your health care provider. Make sure you discuss any questions you have with your health care provider. Document Revised: 02/24/2019 Document Reviewed: 05/05/2018 Elsevier Patient Education   2020 Elsevier Inc.   Cyclobenzaprine tablets What is this medicine? CYCLOBENZAPRINE (sye kloe BEN za preen) is a muscle relaxer. It is used to treat muscle pain, spasms, and stiffness. This medicine may be used for other purposes; ask your health care provider or pharmacist if you have questions. COMMON BRAND NAME(S): Fexmid, Flexeril What should I tell my health care provider before I take this medicine? They need to know if you have any of these conditions:  heart disease, irregular heartbeat, or previous heart attack  liver disease  thyroid problem  an unusual or allergic reaction to cyclobenzaprine, tricyclic antidepressants, lactose, other medicines, foods, dyes, or preservatives  pregnant or trying to get pregnant  breast-feeding How should I use this medicine? Take this medicine by mouth with a glass of water. Follow the directions on the prescription label. If this medicine upsets your stomach, take it with food or milk. Take your medicine at regular intervals. Do not take it more often than directed. Talk to your pediatrician regarding the use of this medicine in children. Special care may be needed. Overdosage: If you think you have taken too much of this medicine contact a poison control center or emergency room at once. NOTE: This medicine is only for you.  Do not share this medicine with others. What if I miss a dose? If you miss a dose, take it as soon as you can. If it is almost time for your next dose, take only that dose. Do not take double or extra doses. What may interact with this medicine? Do not take this medicine with any of the following medications:  MAOIs like Carbex, Eldepryl, Marplan, Nardil, and Parnate  narcotic medicines for cough  safinamide This medicine may also interact with the following medications:  alcohol  bupropion  antihistamines for allergy, cough and cold  certain medicines for anxiety or sleep  certain medicines for bladder  problems like oxybutynin, tolterodine  certain medicines for depression like amitriptyline, fluoxetine, sertraline  certain medicines for Parkinson's disease like benztropine, trihexyphenidyl  certain medicines for seizures like phenobarbital, primidone  certain medicines for stomach problems like dicyclomine, hyoscyamine  certain medicines for travel sickness like scopolamine  general anesthetics like halothane, isoflurane, methoxyflurane, propofol  ipratropium  local anesthetics like lidocaine, pramoxine, tetracaine  medicines that relax muscles for surgery  narcotic medicines for pain  phenothiazines like chlorpromazine, mesoridazine, prochlorperazine, thioridazine  verapamil This list may not describe all possible interactions. Give your health care provider a list of all the medicines, herbs, non-prescription drugs, or dietary supplements you use. Also tell them if you smoke, drink alcohol, or use illegal drugs. Some items may interact with your medicine. What should I watch for while using this medicine? Tell your doctor or health care professional if your symptoms do not start to get better or if they get worse. You may get drowsy or dizzy. Do not drive, use machinery, or do anything that needs mental alertness until you know how this medicine affects you. Do not stand or sit up quickly, especially if you are an older patient. This reduces the risk of dizzy or fainting spells. Alcohol may interfere with the effect of this medicine. Avoid alcoholic drinks. If you are taking another medicine that also causes drowsiness, you may have more side effects. Give your health care provider a list of all medicines you use. Your doctor will tell you how much medicine to take. Do not take more medicine than directed. Call emergency for help if you have problems breathing or unusual sleepiness. Your mouth may get dry. Chewing sugarless gum or sucking hard candy, and drinking plenty of water  may help. Contact your doctor if the problem does not go away or is severe. What side effects may I notice from receiving this medicine? Side effects that you should report to your doctor or health care professional as soon as possible:  allergic reactions like skin rash, itching or hives, swelling of the face, lips, or tongue  breathing problems  chest pain  fast, irregular heartbeat  hallucinations  seizures  unusually weak or tired Side effects that usually do not require medical attention (report to your doctor or health care professional if they continue or are bothersome):  headache  nausea, vomiting This list may not describe all possible side effects. Call your doctor for medical advice about side effects. You may report side effects to FDA at 1-800-FDA-1088. Where should I keep my medicine? Keep out of the reach of children. Store at room temperature between 15 and 30 degrees C (59 and 86 degrees F). Keep container tightly closed. Throw away any unused medicine after the expiration date. NOTE: This sheet is a summary. It may not cover all possible information. If you have questions about this  medicine, talk to your doctor, pharmacist, or health care provider.  2020 Elsevier/Gold Standard (2018-08-12 12:49:26)

## 2020-02-24 ENCOUNTER — Telehealth: Payer: Self-pay | Admitting: Neurology

## 2020-02-24 NOTE — Telephone Encounter (Signed)
no to the covid questions MR Brain w/wo contrast & MRA Head wo contrast Dr. Kathaleen Maser Berkley Harvey: [id number] (exp. 02/24/20 to 08/22/20). Patient is scheduled at [facility] for 03/08/20.

## 2020-03-08 ENCOUNTER — Other Ambulatory Visit: Payer: Self-pay

## 2020-03-08 ENCOUNTER — Ambulatory Visit: Payer: 59

## 2020-03-08 DIAGNOSIS — H5460 Unqualified visual loss, one eye, unspecified: Secondary | ICD-10-CM

## 2020-03-08 DIAGNOSIS — H539 Unspecified visual disturbance: Secondary | ICD-10-CM

## 2020-03-08 DIAGNOSIS — R519 Headache, unspecified: Secondary | ICD-10-CM | POA: Diagnosis not present

## 2020-03-08 DIAGNOSIS — G4484 Primary exertional headache: Secondary | ICD-10-CM

## 2020-03-08 DIAGNOSIS — R51 Headache with orthostatic component, not elsewhere classified: Secondary | ICD-10-CM

## 2020-03-08 MED ORDER — GADOBENATE DIMEGLUMINE 529 MG/ML IV SOLN
20.0000 mL | Freq: Once | INTRAVENOUS | Status: AC | PRN
  Administered 2020-03-08: 20 mL via INTRAVENOUS

## 2020-10-16 IMAGING — MR MR KNEE*L* W/O CM
7 series · 40 of 40 positions shown · non-contrast
Comparison: Plain films left knee 11/08/2019.

CLINICAL DATA: Left knee pain since the patient slipped on a wet
concrete floor 11/07/2019.

EXAM:
MRI OF THE LEFT KNEE WITHOUT CONTRAST
TECHNIQUE: Multiplanar, multisequence MR imaging of the knee was performed. No
intravenous contrast was administered.

[Series 5: T2 fat-sat · axial · 4.0mm · 0.53mm/px · z∈[-100,+70]mm · 6 of 35 slices shown (1 of 3)]
[im 1/35]
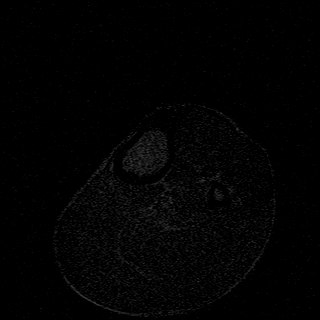
[im 7/35]
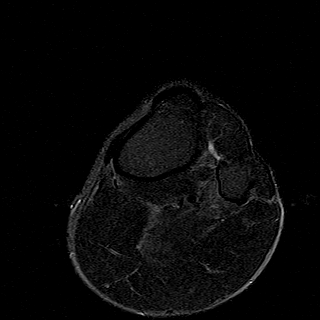
[im 14/35]
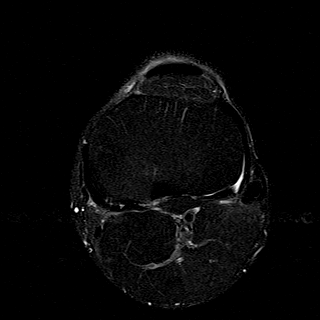
[im 21/35]
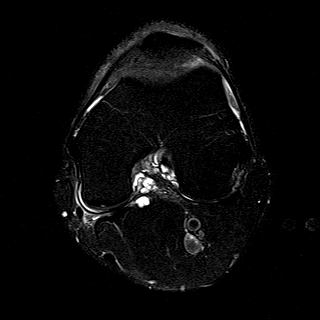
[im 28/35]
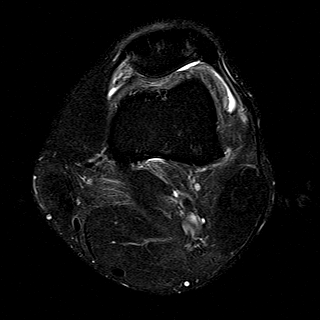
[im 35/35]
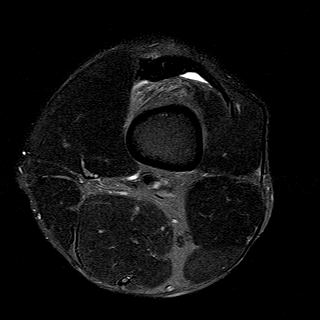

[Series 6: T1 · coronal · 4.0mm · 0.62mm/px · 6 of 33 slices shown]
[im 1/33]
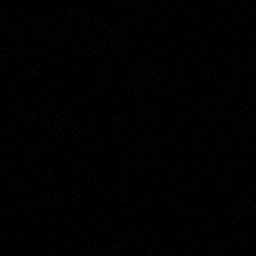
[im 7/33]
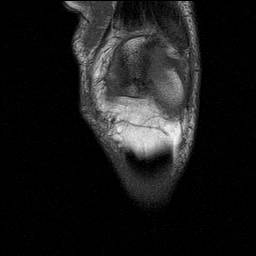
[im 13/33]
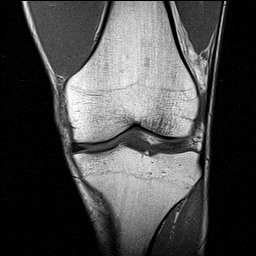
[im 20/33]
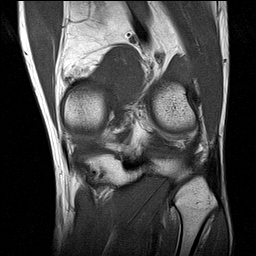
[im 26/33]
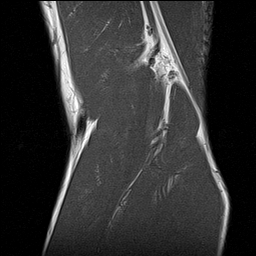
[im 33/33]
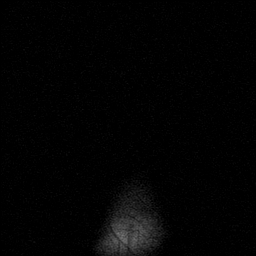

[Series 7: T2 fat-sat · coronal · 4.0mm · 0.62mm/px · 6 of 33 slices shown (2 of 3)]
[im 1/33]
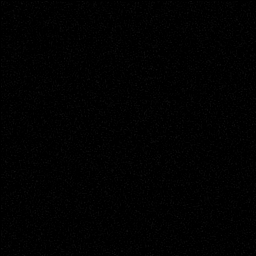
[im 7/33]
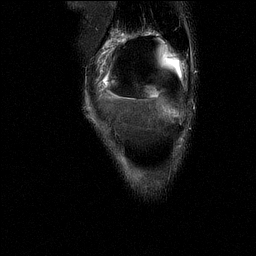
[im 13/33]
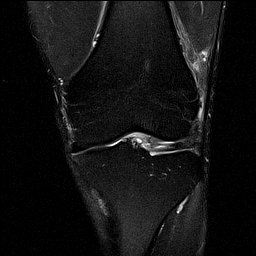
[im 20/33]
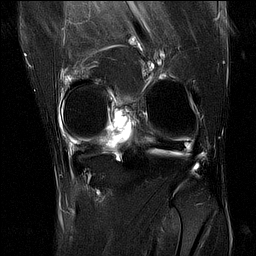
[im 26/33]
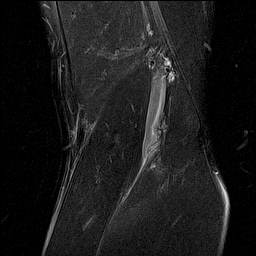
[im 33/33]
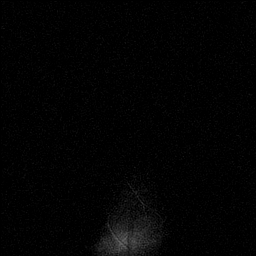

[Series 8: PD fat-sat · coronal · 4.0mm · 0.62mm/px · 6 of 33 slices shown (1 of 3)]
[im 1/33]
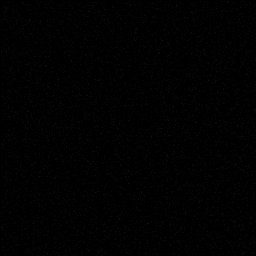
[im 7/33]
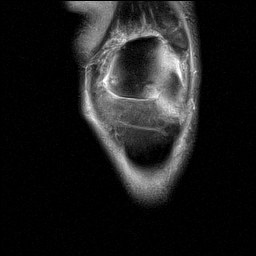
[im 13/33]
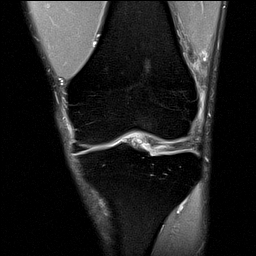
[im 20/33]
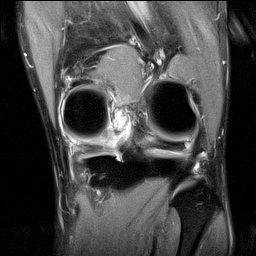
[im 26/33]
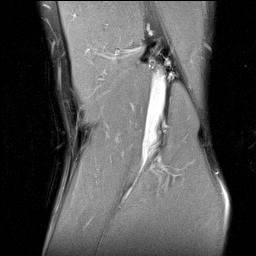
[im 33/33]
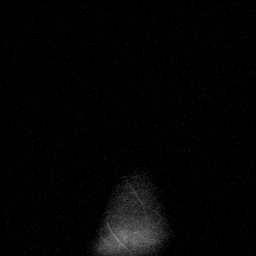

[Series 9: PD fat-sat · sagittal · 3.0mm · 0.62mm/px · 6 of 30 slices shown (2 of 3)]
[im 1/30]
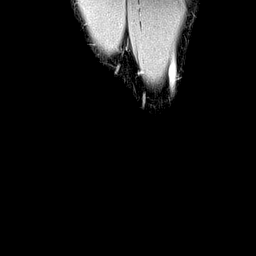
[im 6/30]
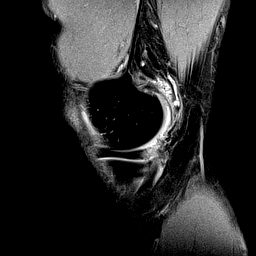
[im 12/30]
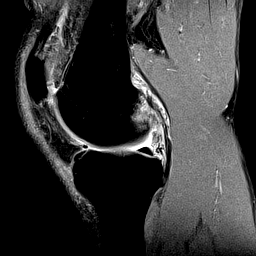
[im 18/30]
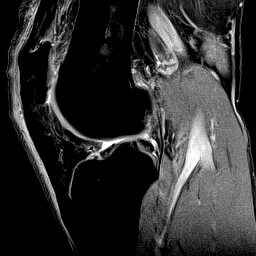
[im 24/30]
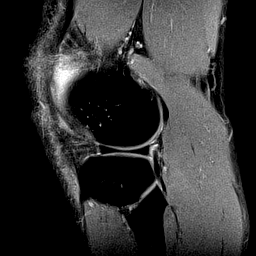
[im 30/30]
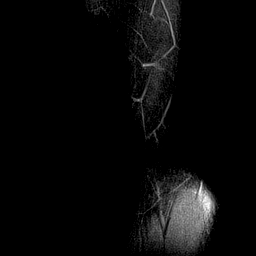

[Series 10: T2 fat-sat · sagittal · 3.0mm · 0.62mm/px · 6 of 30 slices shown (3 of 3)]
[im 1/30]
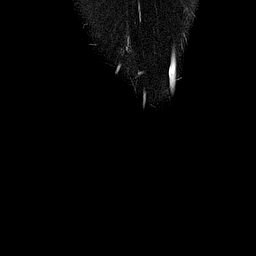
[im 6/30]
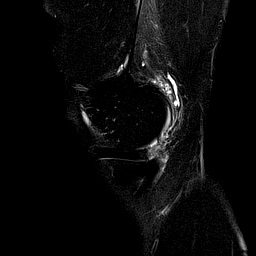
[im 12/30]
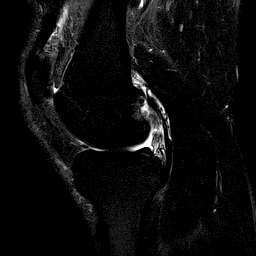
[im 18/30]
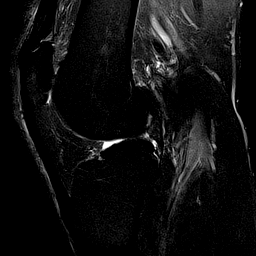
[im 24/30]
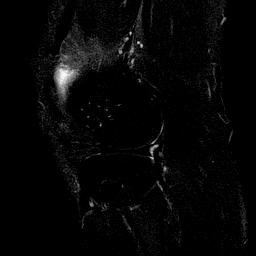
[im 30/30]
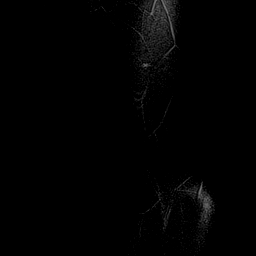

[Series 11: PD fat-sat · oblique · 2.0mm · 0.62mm/px · 4 of 19 slices shown (3 of 3)]
[im 1/19]
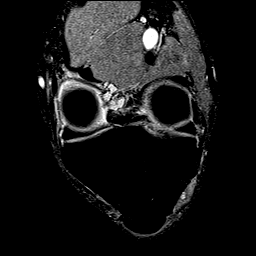
[im 7/19]
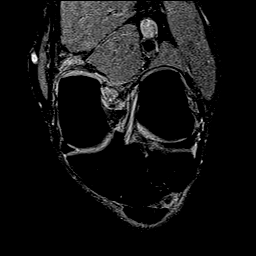
[im 13/19]
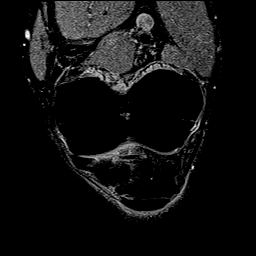
[im 19/19]
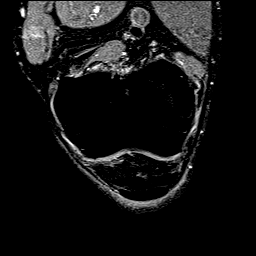

[40 of 40 positions shown; findings below may reference images not displayed]

FINDINGS: MENISCI

Medial meniscus:  Intact.

Lateral meniscus:  Intact.

LIGAMENTS

Cruciates: Intact. There is mucoid degeneration of both ligaments,
worse in the ACL.

Collaterals:  Intact.

CARTILAGE

Patellofemoral:  Mildly degenerated.

Medial:  Minimally degenerated.

Lateral:  Preserved.

Joint:  Small joint effusion.

Popliteal Fossa:  Small Baker's cyst.

Extensor Mechanism:  Intact.

Bones:  No fracture, contusion or worrisome lesion.

Other: Subcutaneous edema is seen anterior to the patella and
patellar tendon.
IMPRESSION: Negative for acute bony abnormality, meniscal or ligament tear. ACL
worse than PCL mucoid degeneration noted.

Subcutaneous edema anterior to the patellar and patellar tendon may
be due to contusion given the patient's history of fall.
